# Patient Record
Sex: Female | Born: 1970 | Hispanic: Yes | Marital: Single | Smoking: Never smoker
Health system: Southern US, Community
[De-identification: ages and names within clinical notes are randomized; demographics above are authoritative.]

## PROBLEM LIST (undated history)

## (undated) HISTORY — PX: TUBAL LIGATION: SHX77

---

## 2008-12-18 ENCOUNTER — Emergency Department: Payer: Self-pay | Admitting: Emergency Medicine

## 2012-01-23 ENCOUNTER — Ambulatory Visit: Payer: Self-pay | Admitting: Family Medicine

## 2013-04-27 ENCOUNTER — Ambulatory Visit: Payer: Self-pay

## 2014-09-08 ENCOUNTER — Ambulatory Visit
Admit: 2014-09-08 | Disposition: A | Discharge status: Home / Self Care | Payer: Self-pay | Attending: Urgent Care | Admitting: Urgent Care

## 2014-09-13 NOTE — Progress Notes (Signed)
Letter mailed to patient to notify of normal mammogram, and pap smear results. Patient  Instructed to return for annual screening.  Copy to [facility].  

## 2014-09-24 NOTE — Progress Notes (Signed)
Letter mailed from [facility] to notify of normal mammogram results.  Patient to return in one year for annual screening.  [facility]IS cervical data sheet in progress.

## 2017-03-25 ENCOUNTER — Ambulatory Visit
Admission: RE | Admit: 2017-03-25 | Discharge: 2017-03-25 | Disposition: A | Discharge status: Home / Self Care | Payer: No Typology Code available for payment source | Source: Ambulatory Visit | Attending: Chiropractor | Admitting: Chiropractor

## 2017-03-25 ENCOUNTER — Other Ambulatory Visit: Payer: Self-pay | Admitting: Chiropractor

## 2017-03-25 DIAGNOSIS — T1490XA Injury, unspecified, initial encounter: Secondary | ICD-10-CM | POA: Insufficient documentation

## 2017-03-25 DIAGNOSIS — M546 Pain in thoracic spine: Secondary | ICD-10-CM | POA: Insufficient documentation

## 2017-03-25 DIAGNOSIS — M542 Cervicalgia: Secondary | ICD-10-CM | POA: Diagnosis present

## 2017-03-25 DIAGNOSIS — M4185 Other forms of scoliosis, thoracolumbar region: Secondary | ICD-10-CM | POA: Insufficient documentation

## 2018-03-24 ENCOUNTER — Ambulatory Visit
Admission: RE | Admit: 2018-03-24 | Discharge: 2018-03-24 | Disposition: A | Discharge status: Home / Self Care | Payer: Self-pay | Source: Ambulatory Visit | Attending: Oncology | Admitting: Oncology

## 2018-03-24 ENCOUNTER — Ambulatory Visit: Payer: Self-pay | Attending: Oncology

## 2018-03-24 ENCOUNTER — Encounter (INDEPENDENT_AMBULATORY_CARE_PROVIDER_SITE_OTHER): Payer: Self-pay

## 2018-03-24 VITALS — BP 138/83 | HR 78 | Temp 98.0°F | Ht <= 58 in | Wt 134.0 lb

## 2018-03-24 DIAGNOSIS — Z0000 Encounter for general adult medical examination without abnormal findings: Secondary | ICD-10-CM

## 2018-03-24 NOTE — Progress Notes (Signed)
  Subjective:     Patient ID: Marius Ditch, female   DOB: 07/27/1970, 47 y.o.   MRN: [id number]  HPI   Review of Systems     Objective:   Physical Exam  Pulmonary/Chest: Right breast exhibits no inverted nipple, no mass, no nipple discharge, no skin change and no tenderness. Left breast exhibits no inverted nipple, no mass, no nipple discharge, no skin change and no tenderness. Breasts are symmetrical.       Assessment:     47 year old hispanic patient presents for [facility] clinic visit.  Delos Haring interpreted exam.  Patient has a twin sister who also comes through [employer].  Patient screened, and meets B[facility]CP eligibility.  Patient does not have insurance, Medicare or Medicaid.  Handout given on Affordable Care Act. Instructed patient on breast self awareness using teach back method.  Clinical breast exam unremarkable. Plan:     Sent for bilateral screening mammogram.

## 2018-03-26 NOTE — Progress Notes (Signed)
Letter mailed from [facility] to notify of normal mammogram results.  Patient to return in one year for annual screening.  Copy to [facility]IS. 

## 2019-03-27 ENCOUNTER — Telehealth: Payer: Self-pay

## 2019-03-27 NOTE — Telephone Encounter (Signed)
Pre-visit screening call attempted prior to [facility] appointment on 03/31/2019. No answer.

## 2019-03-31 ENCOUNTER — Ambulatory Visit: Payer: Self-pay | Attending: Oncology

## 2019-03-31 ENCOUNTER — Ambulatory Visit
Admission: RE | Admit: 2019-03-31 | Discharge: 2019-03-31 | Disposition: A | Discharge status: Home / Self Care | Payer: Self-pay | Source: Ambulatory Visit | Attending: Oncology | Admitting: Oncology

## 2019-03-31 ENCOUNTER — Other Ambulatory Visit: Payer: Self-pay

## 2019-03-31 VITALS — BP 122/85 | HR 73 | Temp 99.0°F | Ht <= 58 in | Wt 133.9 lb

## 2019-03-31 DIAGNOSIS — Z0000 Encounter for general adult medical examination without abnormal findings: Secondary | ICD-10-CM

## 2019-03-31 NOTE — Progress Notes (Signed)
  Subjective:     Patient ID: Rachel Miles, female   DOB: 11-02-1970, 48 y.o.   MRN: [id number]  HPI   Review of Systems     Objective:   Physical Exam Chest:     Breasts:        Right: No swelling, bleeding, inverted nipple, mass, nipple discharge, skin change or tenderness.        Left: No swelling, bleeding, inverted nipple, mass, nipple discharge, skin change or tenderness.  Genitourinary:    Labia:        Right: No rash, tenderness, lesion or injury.        Left: No rash, tenderness, lesion or injury.      Vagina: No signs of injury and foreign body. No vaginal discharge, erythema, tenderness, bleeding, lesions or prolapsed vaginal walls.     Cervix: No cervical motion tenderness, discharge, friability, lesion, erythema, cervical bleeding or eversion.     Uterus: Not deviated, not enlarged, not fixed, not tender and no uterine prolapse.      Adnexa:        Right: No mass, tenderness or fullness.         Left: No mass, tenderness or fullness.          Assessment:     48 year old hispanic patient returns for [facility] clinic visit.  Patient screened, and meets [facility] eligibility.  Patient does not have insurance, Medicare or Medicaid. Instructed patient on breast self awareness using teach back method.  Clinical breast exam unremarkable.  No mass or lump palpated. Adin Hector interpreted exam. Patient works at [employer]. Sister also in [place].  Risk Assessment    Risk Scores      03/31/2019 03/24/2018   Last edited by: Rico Junker, RN Theodore Demark, RN   5-year risk: 0.7 % 0.7 %   Lifetime risk: 6.5 % 6.6 %            Plan:     Sent for bilateral screening mammogram.  Specimen collected.

## 2019-03-31 NOTE — Progress Notes (Signed)
.  pap

## 2019-04-01 NOTE — Progress Notes (Addendum)
Letter mailed from [facility] to notify of normal mammogram results.  Patient to return in one year for annual screening. Letter mailed to notify of negativve/ negative pap results.  Next pap in 5 years. Copy to [facility].

## 2019-04-07 LAB — PAP LB AND HPV HIGH-RISK: HPV, high-risk: NEGATIVE

## 2020-08-24 ENCOUNTER — Ambulatory Visit: Payer: Self-pay | Attending: Oncology

## 2020-08-24 ENCOUNTER — Other Ambulatory Visit: Payer: Self-pay

## 2020-08-24 ENCOUNTER — Ambulatory Visit
Admission: RE | Admit: 2020-08-24 | Discharge: 2020-08-24 | Disposition: A | Discharge status: Home / Self Care | Payer: Self-pay | Source: Ambulatory Visit | Attending: Oncology | Admitting: Oncology

## 2020-08-24 VITALS — BP 117/85 | HR 77 | Temp 98.2°F | Ht 60.0 in | Wt 136.0 lb

## 2020-08-24 DIAGNOSIS — Z0000 Encounter for general adult medical examination without abnormal findings: Secondary | ICD-10-CM | POA: Insufficient documentation

## 2020-08-24 NOTE — Progress Notes (Signed)
  Subjective:     Patient ID: Rachel Miles, female   DOB: [birth year], 50 y.o.   MRN: [id number]  HPI   Review of Systems     Objective:   Physical Exam Chest:  Breasts:     Right: No swelling, bleeding, inverted nipple, mass, nipple discharge, skin change or tenderness.     Left: No swelling, bleeding, inverted nipple, mass, nipple discharge, skin change or tenderness.          Assessment:     50 year old Hispanic patient returns for annual [facility] screening.  Recently had 1st granddaughter 2 months ago.  Maritza Afanador interpreted exam.  Patient screened, and meets B[facility] eligibility.  Patient does not have insurance, Medicare or Medicaid.  Instructed patient on breast self awareness using teach back method.  Clinical breast exam unremarkable. No mass or lump palpated.  Patient has a twin sister who is also seen in the [facility] program.  Risk Assessment    Risk Scores      08/24/2020 03/31/2019   Last edited by: Jim Like, RN Jim Like, RN   5-year risk: 0.7 % 0.7 %   Lifetime risk: 6.3 % 6.5 %            Plan:     Sent for bilateral screening mammogram.

## 2020-09-01 NOTE — Progress Notes (Signed)
Letter mailed from [facility] to notify of normal mammogram results.  Patient to return in one year for annual screening.  Copy to [facility]IS. 

## 2021-11-13 ENCOUNTER — Other Ambulatory Visit: Payer: Self-pay

## 2021-11-13 DIAGNOSIS — Z1231 Encounter for screening mammogram for malignant neoplasm of breast: Secondary | ICD-10-CM

## 2021-11-21 ENCOUNTER — Ambulatory Visit
Admission: RE | Admit: 2021-11-21 | Discharge: 2021-11-21 | Disposition: A | Discharge status: Home / Self Care | Payer: Self-pay | Source: Ambulatory Visit | Attending: Obstetrics and Gynecology | Admitting: Obstetrics and Gynecology

## 2021-11-21 ENCOUNTER — Encounter: Payer: Self-pay | Admitting: *Deleted

## 2021-11-21 ENCOUNTER — Ambulatory Visit: Payer: Self-pay | Attending: Hematology and Oncology | Admitting: *Deleted

## 2021-11-21 DIAGNOSIS — Z1231 Encounter for screening mammogram for malignant neoplasm of breast: Secondary | ICD-10-CM | POA: Insufficient documentation

## 2021-11-21 NOTE — Progress Notes (Signed)
[name] is a 51 y.o. female who presents to [facility] clinic today with no complaints. Patient presents for clinical breast exam and mammogram.    Pap Smear: Pap not smear completed today. Last Pap smear was on 03/31/2019 at [facility] clinic and was normal with negative / negative results. Per patient has no history of an abnormal Pap smear. Last Pap smear result is available in Epic.   Physical exam: Breasts Breasts symmetrical. No skin abnormalities bilateral breasts. No nipple retraction bilateral breasts. No nipple discharge bilateral breasts. No lymphadenopathy. No lumps palpated bilateral breasts.       Pelvic/Bimanual Pap is not indicated today    Smoking History: Patient has never smoked.    Patient Navigation: Patient education provided. Access to services provided for patient through [facility] program. [facility] interpreter Vladimir Crofts [phone] provided interpretation. No transportation provided   Colorectal Cancer Screening: Per patient has never had colonoscopy completed. No complaints today.  Patient had a negative FIT test through [facility] on 06/22/21.   Breast and Cervical Cancer Risk Assessment: Patient does not have family history of breast cancer, known genetic mutations, or radiation treatment to the chest before age 66. Patient does not have history of cervical dysplasia, immunocompromised, or DES exposure in-utero.  Risk Assessment     Risk Scores       11/21/2021 08/24/2020   Last edited by: Narda Rutherford, LPN Jim Like, RN   5-year risk: 0.7 % 0.7 %   Lifetime risk: 6.2 % 6.3 %            A: [facility] exam without pap smear  P: Referred patient to [facility] for a screening mammogram. Appointment scheduled today.  Jim Like, RN 11/21/2021 11:23 AM

## 2022-08-20 IMAGING — MG MM DIGITAL SCREENING BILAT W/ TOMO AND CAD
6 of 10 series · 6 of 30 positions shown · non-contrast
Comparison: Previous exam(s).

CLINICAL DATA: Screening.

EXAM:
DIGITAL SCREENING BILATERAL MAMMOGRAM WITH TOMOSYNTHESIS AND CAD
TECHNIQUE: Bilateral screening digital craniocaudal and mediolateral oblique
mammograms were obtained. Bilateral screening digital breast
tomosynthesis was performed. The images were evaluated with
computer-aided detection.

[R CC synth-2D (1 of 2)]
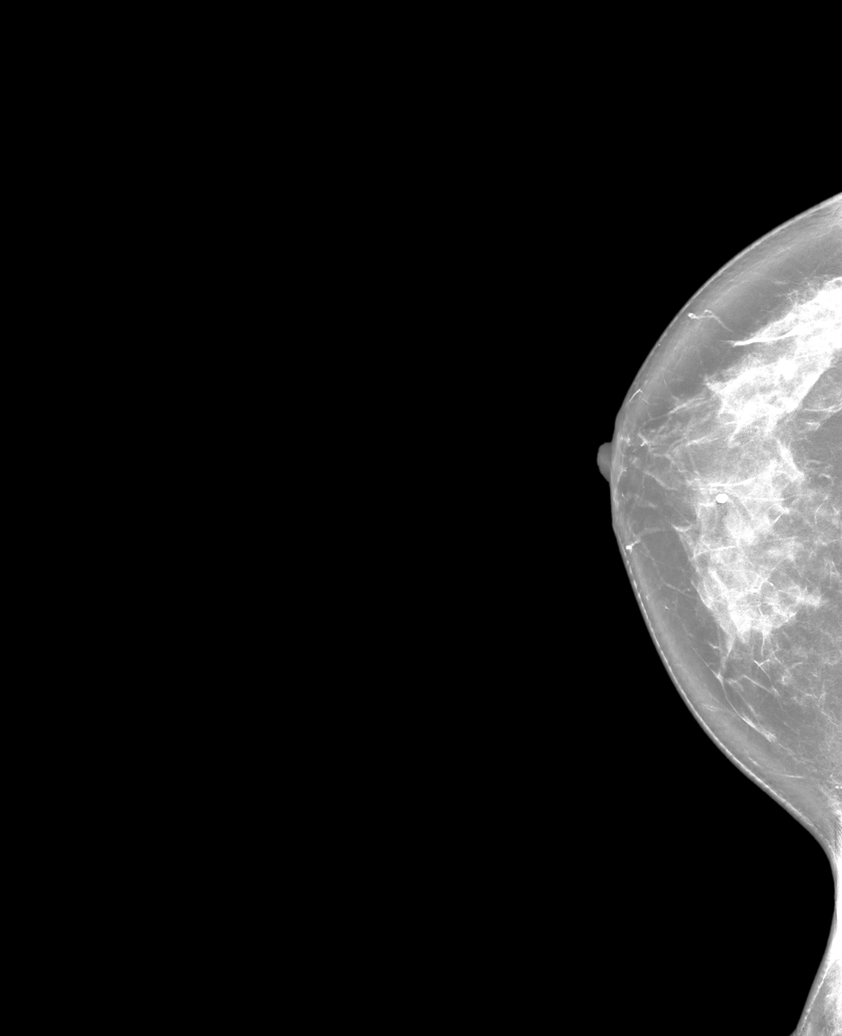

[L MLO synth-2D]
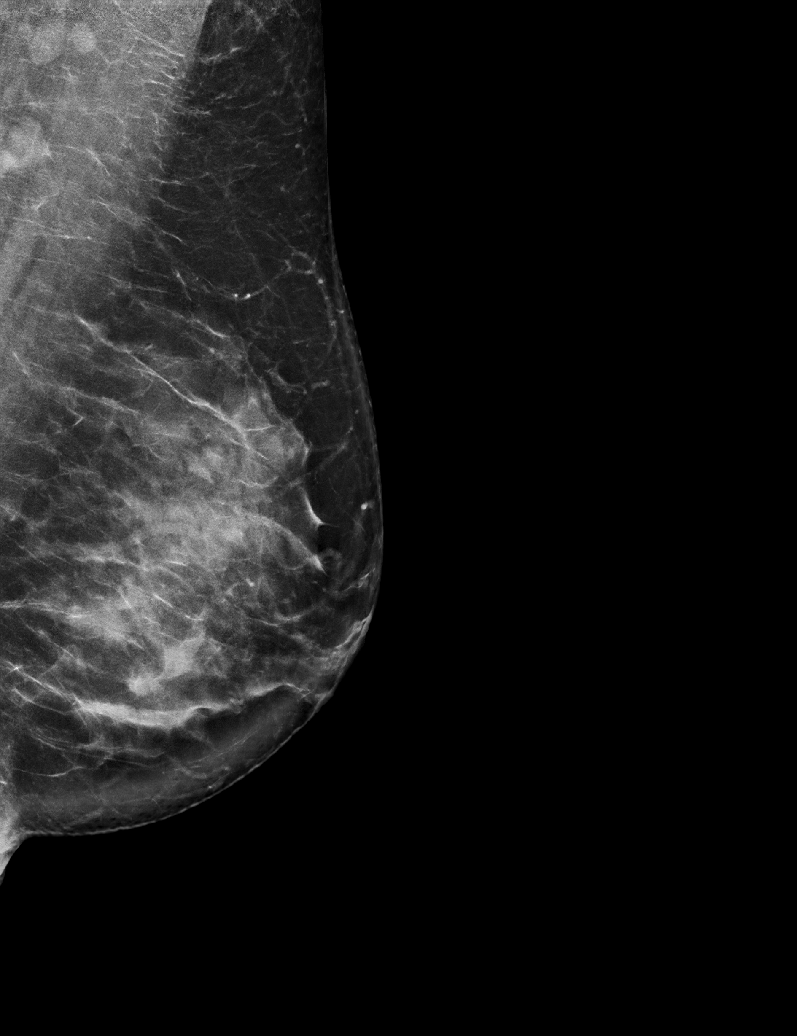

[R CC synth-2D (2 of 2)]
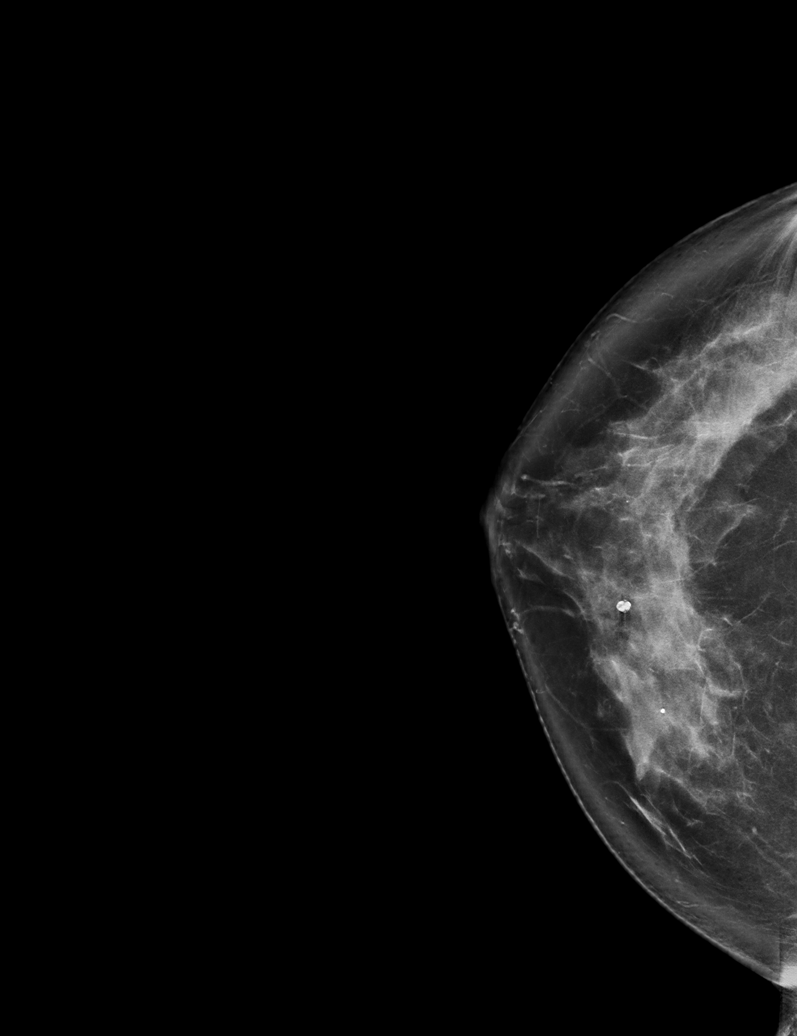

[L CC synth-2D]
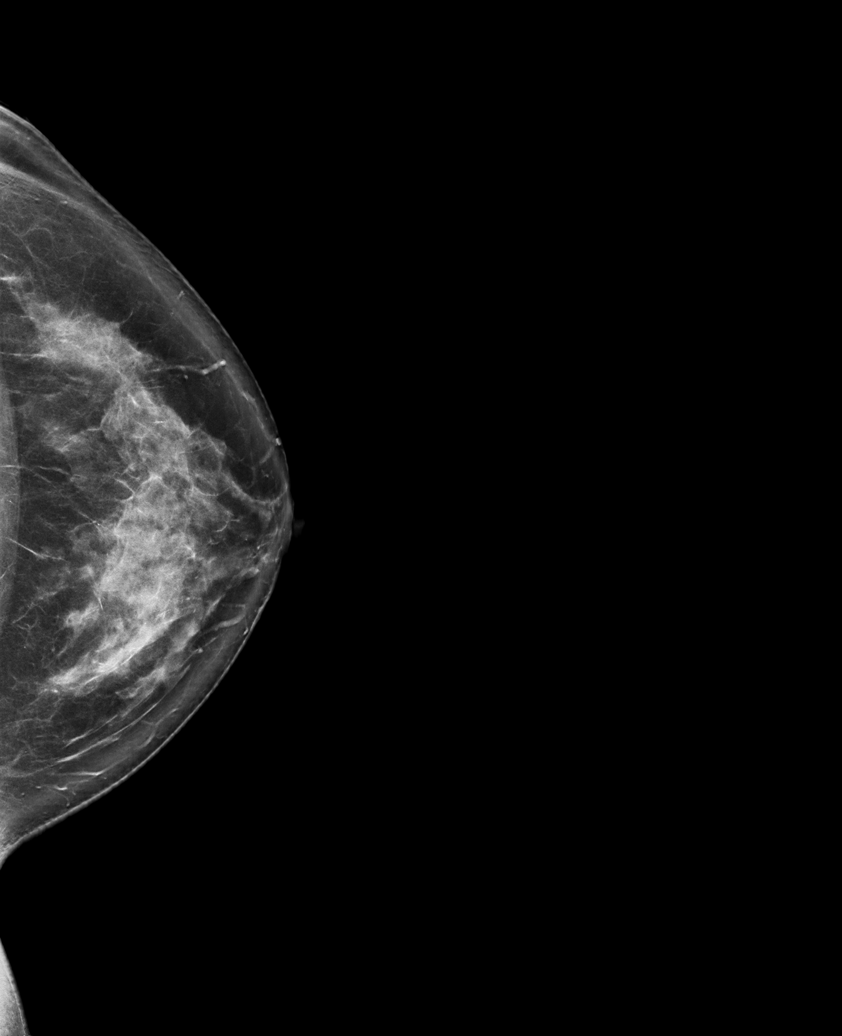

[R MLO synth-2D]
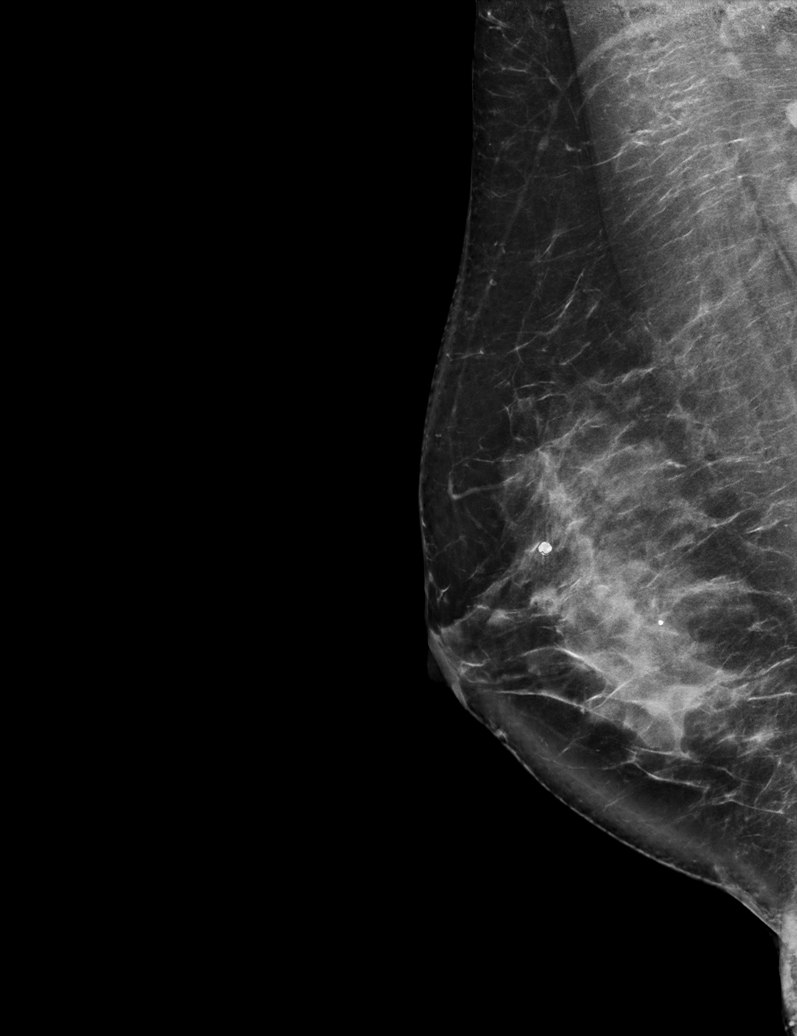

[L CC tomo · tomo slice 46/91.0]
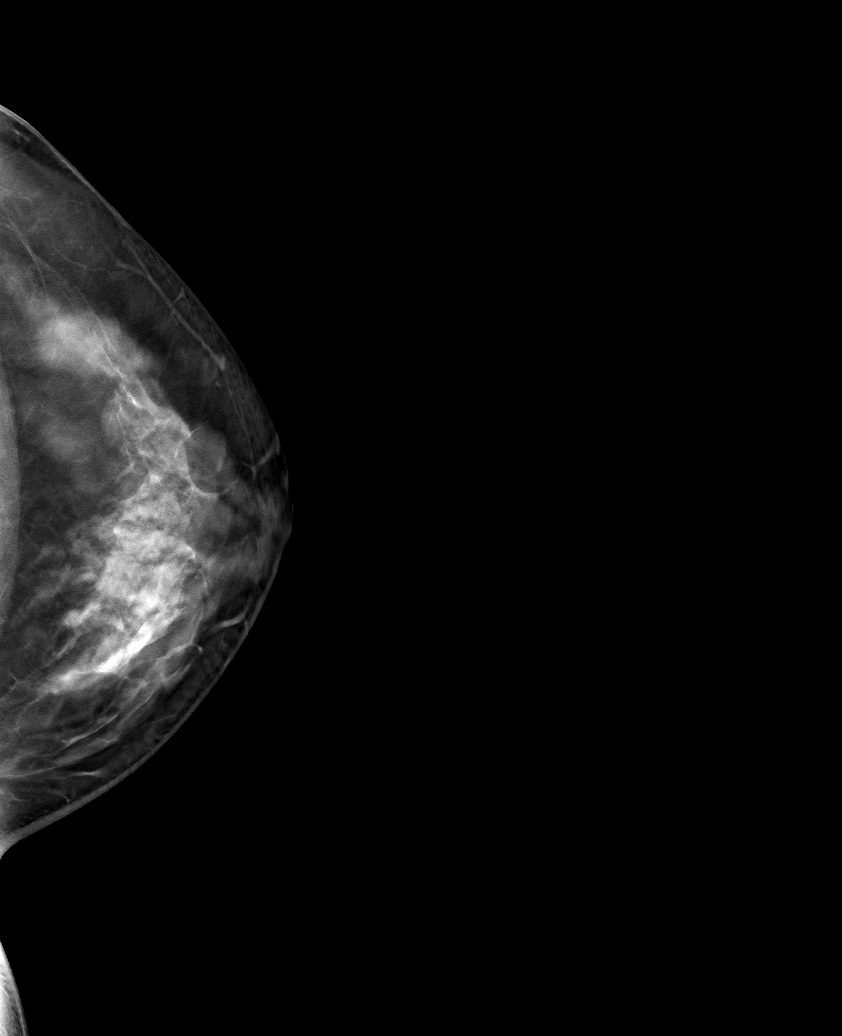

[6 of 30 positions shown; findings below may reference images not displayed]

ACR Breast Density Category c: The breast tissue is heterogeneously
dense, which may obscure small masses.
FINDINGS: There are no findings suspicious for malignancy. The images were
evaluated with computer-aided detection.
IMPRESSION: No mammographic evidence of malignancy. A result letter of this
screening mammogram will be mailed directly to the patient.

RECOMMENDATION:
Screening mammogram in one year. (Code:T4-5-GWO)

BI-RADS CATEGORY  1: Negative.

## 2022-10-02 ENCOUNTER — Other Ambulatory Visit: Payer: Self-pay

## 2022-10-02 DIAGNOSIS — Z1231 Encounter for screening mammogram for malignant neoplasm of breast: Secondary | ICD-10-CM

## 2022-12-10 ENCOUNTER — Ambulatory Visit: Payer: Self-pay

## 2023-02-18 ENCOUNTER — Ambulatory Visit
Admission: RE | Admit: 2023-02-18 | Discharge: 2023-02-18 | Disposition: A | Discharge status: Home / Self Care | Payer: Self-pay | Source: Ambulatory Visit | Attending: Obstetrics and Gynecology | Admitting: Obstetrics and Gynecology

## 2023-02-18 ENCOUNTER — Ambulatory Visit: Payer: Self-pay | Attending: Hematology and Oncology | Admitting: Hematology and Oncology

## 2023-02-18 VITALS — BP 123/76 | Wt 130.9 lb

## 2023-02-18 DIAGNOSIS — Z1231 Encounter for screening mammogram for malignant neoplasm of breast: Secondary | ICD-10-CM | POA: Insufficient documentation

## 2023-02-18 NOTE — Progress Notes (Addendum)
[name] is a 52 y.o. female who presents to [facility] clinic today with no complaints.    Pap Smear: Pap not smear completed today. Last Pap smear was 03/31/2019 at [facility] clinic and was normal. Per patient has no history of an abnormal Pap smear. Last Pap smear result is available in Epic.   Physical exam: Breasts Breasts symmetrical. No skin abnormalities bilateral breasts. No nipple retraction bilateral breasts. No nipple discharge bilateral breasts. No lymphadenopathy. No lumps palpated bilateral breasts.  MS DIGITAL SCREENING TOMO BILATERAL  Result Date: 11/22/2021 CLINICAL DATA:  Screening. EXAM: DIGITAL SCREENING BILATERAL MAMMOGRAM WITH TOMOSYNTHESIS AND CAD TECHNIQUE: Bilateral screening digital craniocaudal and mediolateral oblique mammograms were obtained. Bilateral screening digital breast tomosynthesis was performed. The images were evaluated with computer-aided detection. COMPARISON:  Previous exam(s). ACR Breast Density Category c: The breast tissue is heterogeneously dense, which may obscure small masses. FINDINGS: There are no findings suspicious for malignancy. IMPRESSION: No mammographic evidence of malignancy. A result letter of this screening mammogram will be mailed directly to the patient. RECOMMENDATION: Screening mammogram in one year. (Code:SM-B-01Y) BI-RADS CATEGORY  1: Negative. Electronically Signed   By: Amie Portland M.D.   On: 11/22/2021 09:39   MS DIGITAL SCREENING TOMO BILATERAL  Result Date: 08/25/2020 CLINICAL DATA:  Screening. EXAM: DIGITAL SCREENING BILATERAL MAMMOGRAM WITH TOMOSYNTHESIS AND CAD TECHNIQUE: Bilateral screening digital craniocaudal and mediolateral oblique mammograms were obtained. Bilateral screening digital breast tomosynthesis was performed. The images were evaluated with computer-aided detection. COMPARISON:  Previous exam(s). ACR Breast Density Category c: The breast tissue is heterogeneously dense, which may obscure  small masses. FINDINGS: There are no findings suspicious for malignancy. The images were evaluated with computer-aided detection. IMPRESSION: No mammographic evidence of malignancy. A result letter of this screening mammogram will be mailed directly to the patient. RECOMMENDATION: Screening mammogram in one year. (Code:SM-B-01Y) BI-RADS CATEGORY  1: Negative. Electronically Signed   By: Sherian Rein M.D.   On: 08/25/2020 15:03   MS DIGITAL SCREENING TOMO BILATERAL  Result Date: 03/31/2019 CLINICAL DATA:  Screening. EXAM: DIGITAL SCREENING BILATERAL MAMMOGRAM WITH TOMO AND CAD COMPARISON:  Previous exam(s). ACR Breast Density Category c: The breast tissue is heterogeneously dense, which may obscure small masses. FINDINGS: There are no findings suspicious for malignancy. Images were processed with CAD. IMPRESSION: No mammographic evidence of malignancy. A result letter of this screening mammogram will be mailed directly to the patient. RECOMMENDATION: Screening mammogram in one year. (Code:SM-B-01Y) BI-RADS CATEGORY  1: Negative. Electronically Signed   By: Britta Mccreedy M.D.   On: 03/31/2019 16:54   MS DIGITAL SCREENING TOMO BILATERAL  Result Date: 03/24/2018 CLINICAL DATA:  Screening. EXAM: DIGITAL SCREENING BILATERAL MAMMOGRAM WITH TOMO AND CAD COMPARISON:  Previous exam(s). ACR Breast Density Category c: The breast tissue is heterogeneously dense, which may obscure small masses. FINDINGS: There are no findings suspicious for malignancy. Images were processed with CAD. IMPRESSION: No mammographic evidence of malignancy. A result letter of this screening mammogram will be mailed directly to the patient. RECOMMENDATION: Screening mammogram in one year. (Code:SM-B-01Y) BI-RADS CATEGORY  1: Negative. Electronically Signed   By: Amie Portland M.D.   On: 03/24/2018 12:07         Pelvic/Bimanual Pap is not indicated today    Smoking History: Patient has never smoked and was not referred to quit line.     Patient Navigation: Patient education provided. Access to services provided for patient through [facility] program. Delos Haring interpreter provided. No transportation provided  Colorectal Cancer Screening: Per patient has never had colonoscopy completed No complaints today. FIT test completed, results pending.    Breast and Cervical Cancer Risk Assessment: Patient does not have family history of breast cancer, known genetic mutations, or radiation treatment to the chest before age 38. Patient does not have history of cervical dysplasia, immunocompromised, or DES exposure in-utero.  Risk Scores as of Encounter on 02/18/2023     [name]           5-year 1.11%   Lifetime 8.62%   This patient is Hispana/Latina but has no documented birth country, so the [place] model used data from [place] patients to calculate their risk score. Document a birth country in the Demographics activity for a more accurate score.         Last calculated by Narda Rutherford, LPN on 01/19/1190 at 10:42 AM          A: [facility]CP exam without pap smear No complaints with benign exam.   P: Referred patient to the [facility] for a screening mammogram. Appointment scheduled 02/18/2023.  Pascal Lux, NP 02/18/2023 10:36 AM

## 2023-02-18 NOTE — Patient Instructions (Addendum)
Taught Emer Secundino Carbajal about self breast awareness and gave educational materials to take home. Patient did not need a Pap smear today due to last Pap smear was in 03/31/2019  per patient.  Let her know [facility] will cover Pap smears every 5 years unless has a history of abnormal Pap smears. Referred patient to the [facility] for screening mammogram. Appointment scheduled for 02/18/2023. Patient aware of appointment and will be there. Let patient know will follow up with her within the next couple weeks with results. Azlyn Secundino Carbajal verbalized understanding.  Pascal Lux, NP 10:38 AM

## 2024-03-05 ENCOUNTER — Other Ambulatory Visit: Payer: Self-pay | Admitting: Family Medicine

## 2024-03-05 DIAGNOSIS — Z1231 Encounter for screening mammogram for malignant neoplasm of breast: Secondary | ICD-10-CM

## 2024-03-17 ENCOUNTER — Ambulatory Visit: Payer: Self-pay

## 2024-03-17 ENCOUNTER — Ambulatory Visit
Admission: RE | Admit: 2024-03-17 | Discharge: 2024-03-17 | Disposition: A | Discharge status: Home / Self Care | Payer: Self-pay | Source: Ambulatory Visit | Attending: Family Medicine | Admitting: Family Medicine

## 2024-03-17 DIAGNOSIS — Z1231 Encounter for screening mammogram for malignant neoplasm of breast: Secondary | ICD-10-CM | POA: Insufficient documentation
